# Patient Record
Sex: Female | Born: 2000 | Race: White | Hispanic: No | Marital: Single
Health system: Southern US, Community
[De-identification: ages and names within clinical notes are randomized; demographics above are authoritative.]

## PROBLEM LIST (undated history)

## (undated) DIAGNOSIS — Z9229 Personal history of other drug therapy: Secondary | ICD-10-CM

## (undated) DIAGNOSIS — M25362 Other instability, left knee: Secondary | ICD-10-CM

## (undated) DIAGNOSIS — Z87898 Personal history of other specified conditions: Secondary | ICD-10-CM

## (undated) DIAGNOSIS — L709 Acne, unspecified: Secondary | ICD-10-CM

## (undated) DIAGNOSIS — E039 Hypothyroidism, unspecified: Secondary | ICD-10-CM

## (undated) DIAGNOSIS — M6752 Plica syndrome, left knee: Secondary | ICD-10-CM

---
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
